# Patient Record
Sex: Female | Born: 1980 | Race: White | Hispanic: No | Marital: Single
Health system: Southern US, Community
[De-identification: ages and names within clinical notes are randomized; demographics above are authoritative.]

---

## 2008-05-16 ENCOUNTER — Emergency Department: Payer: Self-pay | Admitting: Emergency Medicine

## 2012-02-12 ENCOUNTER — Emergency Department: Payer: Self-pay | Admitting: Emergency Medicine

## 2012-02-12 LAB — URINALYSIS, COMPLETE
Bacteria: NONE SEEN
Blood: NEGATIVE
Glucose,UR: NEGATIVE mg/dL (ref 0–75)
Leukocyte Esterase: NEGATIVE
Nitrite: NEGATIVE
Ph: 8 (ref 4.5–8.0)
Protein: NEGATIVE
Specific Gravity: 1.006 (ref 1.003–1.030)
WBC UR: <1 /HPF (ref 0–5)

## 2012-07-12 ENCOUNTER — Emergency Department: Payer: Self-pay | Admitting: Emergency Medicine

## 2012-07-13 ENCOUNTER — Emergency Department: Payer: Self-pay | Admitting: Emergency Medicine

## 2012-07-13 LAB — URINALYSIS, COMPLETE
Bacteria: NONE SEEN
Glucose,UR: NEGATIVE mg/dL (ref 0–75)
Ketone: NEGATIVE
Nitrite: NEGATIVE
Ph: 5 (ref 4.5–8.0)
Protein: NEGATIVE
RBC,UR: 2 /HPF (ref 0–5)
Specific Gravity: 1.017 (ref 1.003–1.030)
WBC UR: 3 /HPF (ref 0–5)

## 2012-07-13 LAB — COMPREHENSIVE METABOLIC PANEL
Albumin: 4.2 g/dL (ref 3.4–5.0)
Alkaline Phosphatase: 74 U/L (ref 50–136)
Anion Gap: 8 (ref 7–16)
BUN: 12 mg/dL (ref 7–18)
Bilirubin,Total: 0.9 mg/dL (ref 0.2–1.0)
Chloride: 107 mmol/L (ref 98–107)
Co2: 25 mmol/L (ref 21–32)
Creatinine: 1.09 mg/dL (ref 0.60–1.30)
EGFR (African American): >60
EGFR (Non-African Amer.): >60
Potassium: 3.9 mmol/L (ref 3.5–5.1)
SGOT(AST): 17 U/L (ref 15–37)
SGPT (ALT): 17 U/L (ref 12–78)
Sodium: 140 mmol/L (ref 136–145)

## 2012-07-13 LAB — CBC
HCT: 43.7 % (ref 35.0–47.0)
Platelet: 305 10*3/uL (ref 150–440)
RDW: 13.2 % (ref 11.5–14.5)

## 2012-07-13 LAB — DRUG SCREEN, URINE
Barbiturates, Ur Screen: POSITIVE (ref ?–200)
Benzodiazepine, Ur Scrn: NEGATIVE (ref ?–200)
Cannabinoid 50 Ng, Ur ~~LOC~~: NEGATIVE (ref ?–50)
Methadone, Ur Screen: NEGATIVE (ref ?–300)
Opiate, Ur Screen: NEGATIVE (ref ?–300)
Phencyclidine (PCP) Ur S: NEGATIVE (ref ?–25)
Tricyclic, Ur Screen: POSITIVE (ref ?–1000)

## 2012-07-13 LAB — TSH: Thyroid Stimulating Horm: 0.87 u[IU]/mL

## 2012-07-13 LAB — ETHANOL
Ethanol %: <0.003 % (ref 0.000–0.080)
Ethanol: <3 mg/dL

## 2012-07-14 LAB — PREGNANCY, URINE: Pregnancy Test, Urine: NEGATIVE m[IU]/mL

## 2012-07-14 LAB — ACETAMINOPHEN LEVEL: Acetaminophen: 4 ug/mL — ABNORMAL LOW

## 2013-01-12 ENCOUNTER — Emergency Department: Payer: Self-pay | Admitting: Emergency Medicine

## 2013-01-12 LAB — ETHANOL
Ethanol %: <0.003 % (ref 0.000–0.080)
Ethanol: <3 mg/dL

## 2013-01-12 LAB — URINALYSIS, COMPLETE
Bilirubin,UR: NEGATIVE
Blood: NEGATIVE
Glucose,UR: NEGATIVE mg/dL (ref 0–75)
Ketone: NEGATIVE
Nitrite: NEGATIVE
Protein: NEGATIVE

## 2013-01-12 LAB — CBC
MCH: 30.3 pg (ref 26.0–34.0)
MCHC: 34.4 g/dL (ref 32.0–36.0)
MCV: 88 fL (ref 80–100)
RBC: 4.48 10*6/uL (ref 3.80–5.20)
RDW: 13.3 % (ref 11.5–14.5)

## 2013-01-12 LAB — DRUG SCREEN, URINE
Amphetamines, Ur Screen: NEGATIVE (ref ?–1000)
Barbiturates, Ur Screen: NEGATIVE (ref ?–200)
Benzodiazepine, Ur Scrn: NEGATIVE (ref ?–200)
Cannabinoid 50 Ng, Ur ~~LOC~~: NEGATIVE (ref ?–50)
Cocaine Metabolite,Ur ~~LOC~~: NEGATIVE (ref ?–300)
MDMA (Ecstasy)Ur Screen: NEGATIVE (ref ?–500)
Methadone, Ur Screen: NEGATIVE (ref ?–300)
Tricyclic, Ur Screen: NEGATIVE (ref ?–1000)

## 2013-01-12 LAB — COMPREHENSIVE METABOLIC PANEL
Anion Gap: 7 (ref 7–16)
BUN: 11 mg/dL (ref 7–18)
Bilirubin,Total: 0.4 mg/dL (ref 0.2–1.0)
Chloride: 106 mmol/L (ref 98–107)
Creatinine: 0.69 mg/dL (ref 0.60–1.30)
Osmolality: 278 (ref 275–301)
SGOT(AST): 16 U/L (ref 15–37)

## 2013-01-12 LAB — TSH: Thyroid Stimulating Horm: 1.16 u[IU]/mL

## 2013-09-03 ENCOUNTER — Ambulatory Visit: Payer: Self-pay | Admitting: Unknown Physician Specialty

## 2013-10-21 ENCOUNTER — Emergency Department: Payer: Self-pay | Admitting: Emergency Medicine

## 2013-10-21 LAB — CBC
HCT: 41.1 % (ref 35.0–47.0)
HGB: 13.5 g/dL (ref 12.0–16.0)
MCH: 29.5 pg (ref 26.0–34.0)
MCHC: 33 g/dL (ref 32.0–36.0)
MCV: 90 fL (ref 80–100)
Platelet: 278 10*3/uL (ref 150–440)
RBC: 4.59 10*6/uL (ref 3.80–5.20)
RDW: 12.9 % (ref 11.5–14.5)
WBC: 7.6 10*3/uL (ref 3.6–11.0)

## 2013-10-21 LAB — URINALYSIS, COMPLETE
BILIRUBIN, UR: NEGATIVE
GLUCOSE, UR: NEGATIVE mg/dL (ref 0–75)
KETONE: NEGATIVE
Leukocyte Esterase: NEGATIVE
NITRITE: NEGATIVE
Ph: 6 (ref 4.5–8.0)
Protein: NEGATIVE
Specific Gravity: 1.019 (ref 1.003–1.030)
WBC UR: 1 /HPF (ref 0–5)

## 2013-10-21 LAB — BASIC METABOLIC PANEL
Anion Gap: 6 — ABNORMAL LOW (ref 7–16)
BUN: 9 mg/dL (ref 7–18)
CHLORIDE: 109 mmol/L — AB (ref 98–107)
Calcium, Total: 9.4 mg/dL (ref 8.5–10.1)
Co2: 26 mmol/L (ref 21–32)
Creatinine: 0.85 mg/dL (ref 0.60–1.30)
EGFR (Non-African Amer.): >60
GLUCOSE: 82 mg/dL (ref 65–99)
OSMOLALITY: 279 (ref 275–301)
POTASSIUM: 4.1 mmol/L (ref 3.5–5.1)
Sodium: 141 mmol/L (ref 136–145)

## 2013-10-21 LAB — GC/CHLAMYDIA PROBE AMP

## 2013-10-21 LAB — WET PREP, GENITAL

## 2013-10-26 ENCOUNTER — Emergency Department: Payer: Self-pay | Admitting: Emergency Medicine

## 2013-10-26 LAB — COMPREHENSIVE METABOLIC PANEL
ALK PHOS: 52 U/L
Albumin: 3.4 g/dL (ref 3.4–5.0)
Anion Gap: 11 (ref 7–16)
BUN: 10 mg/dL (ref 7–18)
Bilirubin,Total: 0.6 mg/dL (ref 0.2–1.0)
CALCIUM: 8.9 mg/dL (ref 8.5–10.1)
CO2: 23 mmol/L (ref 21–32)
Chloride: 107 mmol/L (ref 98–107)
Creatinine: 0.72 mg/dL (ref 0.60–1.30)
Glucose: 98 mg/dL (ref 65–99)
Osmolality: 280 (ref 275–301)
POTASSIUM: 3.6 mmol/L (ref 3.5–5.1)
SGOT(AST): 6 U/L — ABNORMAL LOW (ref 15–37)
SGPT (ALT): 12 U/L (ref 12–78)
Sodium: 141 mmol/L (ref 136–145)
Total Protein: 6.3 g/dL — ABNORMAL LOW (ref 6.4–8.2)

## 2013-10-26 LAB — URINALYSIS, COMPLETE
Bacteria: NONE SEEN
Bilirubin,UR: NEGATIVE
Glucose,UR: NEGATIVE mg/dL (ref 0–75)
Ketone: NEGATIVE
Nitrite: NEGATIVE
PH: 5 (ref 4.5–8.0)
Protein: 30
RBC,UR: 7 /HPF (ref 0–5)
SPECIFIC GRAVITY: 1.025 (ref 1.003–1.030)
WBC UR: NONE SEEN /HPF (ref 0–5)

## 2013-10-26 LAB — CBC
HCT: 40.6 % (ref 35.0–47.0)
HGB: 13.6 g/dL (ref 12.0–16.0)
MCH: 29.6 pg (ref 26.0–34.0)
MCHC: 33.5 g/dL (ref 32.0–36.0)
MCV: 88 fL (ref 80–100)
Platelet: 273 10*3/uL (ref 150–440)
RBC: 4.59 10*6/uL (ref 3.80–5.20)
RDW: 12.8 % (ref 11.5–14.5)
WBC: 8.9 10*3/uL (ref 3.6–11.0)

## 2013-11-25 ENCOUNTER — Emergency Department: Payer: Self-pay | Admitting: Emergency Medicine

## 2013-12-29 ENCOUNTER — Emergency Department: Payer: Self-pay | Admitting: Emergency Medicine

## 2014-01-26 ENCOUNTER — Emergency Department: Payer: Self-pay | Admitting: Emergency Medicine

## 2014-01-26 LAB — CBC
HCT: 40.5 % (ref 35.0–47.0)
HGB: 13.2 g/dL (ref 12.0–16.0)
MCH: 29.6 pg (ref 26.0–34.0)
MCHC: 32.5 g/dL (ref 32.0–36.0)
MCV: 91 fL (ref 80–100)
Platelet: 212 10*3/uL (ref 150–440)
RBC: 4.45 10*6/uL (ref 3.80–5.20)
RDW: 13.3 % (ref 11.5–14.5)
WBC: 9.5 10*3/uL (ref 3.6–11.0)

## 2014-01-26 LAB — URINALYSIS, COMPLETE
Bacteria: NONE SEEN
Bilirubin,UR: NEGATIVE
Glucose,UR: NEGATIVE mg/dL (ref 0–75)
Ketone: NEGATIVE
LEUKOCYTE ESTERASE: NEGATIVE
Nitrite: NEGATIVE
PH: 5 (ref 4.5–8.0)
Protein: NEGATIVE
RBC,UR: 22 /HPF (ref 0–5)
SPECIFIC GRAVITY: 1.015 (ref 1.003–1.030)
Squamous Epithelial: 3
WBC UR: 1 /HPF (ref 0–5)

## 2014-01-26 LAB — LIPASE, BLOOD: Lipase: 133 U/L (ref 73–393)

## 2014-01-26 LAB — HEPATIC FUNCTION PANEL A (ARMC)

## 2014-01-27 LAB — COMPREHENSIVE METABOLIC PANEL
ANION GAP: 6 — AB (ref 7–16)
Albumin: 3.5 g/dL (ref 3.4–5.0)
Alkaline Phosphatase: 48 U/L
BILIRUBIN TOTAL: 0.5 mg/dL (ref 0.2–1.0)
BUN: 10 mg/dL (ref 7–18)
CALCIUM: 8.4 mg/dL — AB (ref 8.5–10.1)
Chloride: 111 mmol/L — ABNORMAL HIGH (ref 98–107)
Co2: 24 mmol/L (ref 21–32)
Creatinine: 0.78 mg/dL (ref 0.60–1.30)
EGFR (Non-African Amer.): >60
Glucose: 91 mg/dL (ref 65–99)
Osmolality: 280 (ref 275–301)
Potassium: 3.8 mmol/L (ref 3.5–5.1)
SGOT(AST): 8 U/L — ABNORMAL LOW (ref 15–37)
SGPT (ALT): 12 U/L — ABNORMAL LOW
Sodium: 141 mmol/L (ref 136–145)
TOTAL PROTEIN: 6.5 g/dL (ref 6.4–8.2)

## 2014-02-21 ENCOUNTER — Emergency Department: Payer: Self-pay | Admitting: Internal Medicine

## 2014-04-12 ENCOUNTER — Ambulatory Visit: Payer: Self-pay | Admitting: Psychiatry

## 2014-04-12 LAB — GLUCOSE, RANDOM: GLUCOSE: 91 mg/dL (ref 65–99)

## 2014-04-12 LAB — LIPID PANEL
CHOLESTEROL: 206 mg/dL — AB (ref 0–200)
HDL: 42 mg/dL (ref 40–60)
LDL CHOLESTEROL, CALC: 125 mg/dL — AB (ref 0–100)
TRIGLYCERIDES: 194 mg/dL (ref 0–200)
VLDL Cholesterol, Calc: 39 mg/dL (ref 5–40)

## 2014-04-12 LAB — TSH: Thyroid Stimulating Horm: 0.392 u[IU]/mL — ABNORMAL LOW

## 2014-08-20 NOTE — Consult Note (Signed)
PATIENT NAME:  Tara Knight, MOULD MR#:  [id number] DATE OF BIRTH:  May 16, 1980  DATE OF CONSULTATION:  01/13/2013  REFERRING PHYSICIAN: Daryel November, MD.  CONSULTING PHYSICIAN:  Ardeen Fillers. Garnetta Buddy, MD.  REASON FOR CONSULTATION: Requesting detox and having suicidal ideations.   HISTORY OF PRESENT ILLNESS: The patient is a 34 year old Caucasian female who presented to the ED accompanied by her boyfriend, Tara Knight, and reported that she has been feeling suicidal and just wants to go to sleep and not wake up. She was tearful during the initial interview. She reported that she is currently experiencing migraine and has been without her medications for several months. She reported that she moved here from [place], [place], and has not been able to see any doctors. She reported that things have been getting worse over the last couple of weeks. Reported that she has been feeling severely depressed and has been crying a lot with mood swings, irritable, and having thoughts of suicide.   She reported that she also recently found out that she has lumps in her breasts and is very worried about the same. The patient reported that she needs help and does not get any medications for her depression.   During my interview, the patient reported that she has been diagnosed with bipolar anxiety and ADD and was seeing Dr. Edmon Crape in [place]. She reported that she moved from [place] and has been off her medications. Since she stopped taking her medications, she has been feeling horrible, worthless, and depressed. She stated that she has been sleeping more as she is experiencing withdrawal from her current medications.   She reported that Dr. Edmon Crape was prescribing her Prozac, Klonopin, trazodone, Vyvanse and  Abilify. She stated that she is not taking the same medications for the past couple of weeks. The patient stated that she is currently not feeling any suicidal thoughts or ideations and just wanted  to be restarted back on the same medications.   When I asked her, why she is unable to find any psychiatrist in the [place] area since she has been in [place] for the past 1 year, she reported that she was going back and forth to see Dr. Edmon Crape in [place], and for the past couple of months she was unable to find anybody.   The patient reported that now she is looking for somebody who accepts [facility] since it is very difficult to find some psychiatrists who will accept her insurance. She reported that she wants the  prescription of her medications and then she will be happy to be discharged from the ED at this time. She currently denied having any perceptual disturbances. She denied having any suicidal or homicidal ideations or plans.   PAST PSYCHIATRIC HISTORY: The patient was also evaluated in March 2014 after she came here and was abusing OxyContin and oxycodone as well as barbiturates and heroin. She was sent to [facility] at that time. She reported history of suicide attempts and also has overdose on pills in the past. At that time, the patient reported that she was following Dr. Edmon Crape in [place] and had not seen him for 2 months as well. The patient reported that she has been trying to get a psychiatrist, and since she was unable to find anybody, she started using opioids.   SUBSTANCE ABUSE HISTORY: The patient has long history of using alcohol, cannabis, cocaine, opioids, and was using oxycodone and OxyContin. Her drug history was obtained from the [facility] and it was  noted that the patient has been steadily using Suboxone, and it was prescribed from the practitioners in [place] and [place] area. She received her last prescription in August 2014. The patient did not provide this information during my interview with her.   SOCIAL HISTORY: The patient was married for 15 years and has 2 children. She reported that her husband has now moved with the children to  [place] and she is currently living with her boyfriend in the [place] area.   REVIEW OF SYSTEMS:  CONSTITUTIONAL: Denies any fever or chills. No weight changes.  EYES: No double or blurred vision.  RESPIRATORY: No shortness of breath or cough.  CARDIOVASCULAR: Denies any chest pain or orthopnea.  GASTROINTESTINAL: No abdominal pain, nausea, vomiting, diarrhea.  GENITOURINARY: No incontinence or frequency.  ENDOCRINE: No heat or cold intolerance.  LYMPHATIC: No anemia or easy bruising.  INTEGUMENTARY: No acne or rash.  MUSCULOSKELETAL: Denies having any muscle pain at this time.  NEUROLOGIC: No tingling or weakness.   MENTAL STATUS: The patient is a moderately built female who appeared her stated age. She was calm and collected. She was manipulative during the interview and was not forthcoming with her information regarding her use of Suboxone. Her thought process was circumstantial. Her thought content was nondelusional. She currently denied having any suicidal or homicidal ideation or plan. She denied having any thoughts to harm herself or others. She denied having any perceptual disturbances.   DIAGNOSTIC IMPRESSION:  AXIS I: 1.  Mood disorder, not otherwise specified.     2.  Polysubstance dependence.  AXIS II: Personality disorder, not otherwise specified.  AXIS III: Please review the medical history.   TREATMENT PLAN:  1.  The patient is not on involuntary commitment and does not meet the criteria for same.  2.  Obtained correct information from Dr. Drema Dallasipton's office and they reported they have not seen this patient since last year. The patient's collateral  information was also obtained from the [facility]  Cont[facility] and she was getting Suboxone steadily from physicians in [place] and [place] area.  3.  She will be discharged from the ED at this time as she does not meet any criteria, and she was provided information about following up with Dr Lucianne MussLima at [facility], an appointment given in the next 2 days. The patient demonstrated understanding. She was advised to come back if she noticed worsening of her symptoms.   Thank you for allowing me to participate in the care of this patient.    ____________________________ Ardeen FillersUzma S. Garnetta BuddyFaheem, MD usf:np D: 01/13/2013 15:52:28 ET T: 01/13/2013 17:22:10 ET JOB#: [id number]  cc: Ardeen FillersUzma S. Garnetta BuddyFaheem, MD, <Dictator> Rhunette CroftUZMA S Rozetta Stumpp MD ELECTRONICALLY SIGNED 01/20/2013 9:07

## 2014-08-20 NOTE — Consult Note (Signed)
PATIENT NAME:  Tara Knight, Tara Knight MR#:  [id number] DATE OF BIRTH:  1980/07/01  DATE OF CONSULTATION:  07/14/2012  REFERRING PHYSICIAN:  Caleen Jobshristine M. Brien MatesBraud, MD    CONSULTING PHYSICIAN:  Ardeen FillersUzma S. Garnetta BuddyFaheem, MD  REASON FOR ADMISSION: Requesting detox voluntarily.    HISTORY OF PRESENT ILLNESS: The patient is a 34 year old married female who came into the ER requesting detox on a voluntary basis. She stated that she came for help. Her UDS was positive for barbiturates. She started crying when placed in the room and reported that she came in here as she was unable to control herself. She was placed under involuntary commitment as a danger to herself as she was attempting to leave later on when the staff was trying to get more information. The patient reported that she had been diagnosed with bipolar disorder at the age of 34. She was living in [place] with her husband and 2 children, and then she started using drugs with her boyfriend. She is currently abusing OxyContin and oxycodone as well as barbiturates and heroin.  The patient stated that she has been using heroin steadily, and then she wants to get off of the drugs. The patient reported that she wants to go to the rehabilitation program to control her drug use. She reported that her husband was supportive and has been taking care of her 117 and 35 year old children. She appeared very sad and tearful during the interview. She reported that she has been living with her boyfriend in [place]. She came to the ER to get help.   PAST PSYCHIATRIC HISTORY: The patient reported history of inpatient hospitalization in the past. She has history of suicide attempts. She overdosed on the pills in the past. She is currently being followed by Dr. Edmon Crapeipton in [place], [place]. Her last appointment was 2 months ago. She was tried on Prozac, Xanax and Vyvanse in the past. The patient reported that she has not taken her medications in a couple of months.    SUBSTANCE ABUSE HISTORY: The patient reported that she has history of using alcohol in the past. She has also tried cannabis as well as used cocaine. She is currently addicted to opioids which have been prescribed on a steady basis, but currently she is taking to oxycodone and OxyContin.   SOCIAL HISTORY: The patient was married for 15 years and has 2 children. She stated that her husband is very supportive and is currently taking care of her children.    REVIEW OF SYSTEMS:  CONSTITUTIONAL: No fever or chills. No weight changes.  EYES: No double or blurred vision.  RESPIRATORY: No shortness of breath or cough.  CARDIOVASCULAR: No chest pain or orthopnea.  GASTROINTESTINAL: No abdominal pain, nausea, vomiting, diarrhea.  GENITOURINARY: No incontinence or frequency.  ENDOCRINE: No heat or cold intolerance.  LYMPHATIC: No anemia or easy bruising.  INTEGUMENTARY: No acne or rash.  MUSCULOSKELETAL: Having muscle pain at this time.  NEUROLOGIC: No tingling or weakness.   MENTAL STATUS EXAMINATION: The patient is a moderately-built female who appeared somewhat anxious and tired during the interview. She reported that her mood is depressed. Affect was congruent. Thought process was logical and goal-directed. Thought content was circumstantial. She denied having any suicidal ideation or plan. She remained focused on her drug use.   DIAGNOSTIC IMPRESSION: AXIS I:  1.  Mood disorder, not otherwise specified.  2.  Opioid dependence.   AXIS II: None.   AXIS III: Please review the medical history.  TREATMENT PLAN: 1.  The patient is currently under involuntary commitment.  2.  I will start her on opioid withdrawal protocol including clonidine 0.1 mg p.o. q. 6 hours p.r.Knight., Flexeril 10 mg p.o. 3 times daily p.r.Knight., lorazepam 0.5 mg p.o. q. 6 hours p.r.Knight. for anxiety symptoms, trazodone 100 mg p.o. at bedtime.  3.  She will be monitored closely for opioid withdrawal and will be referred to [facility]  for stabilization.      Thank you for allowing me to participate in the care of this patient.   ____________________________ Ardeen Fillers. Garnetta Buddy, MD usf:cb D: 07/15/2012 14:12:42 ET T: 07/15/2012 14:31:03 ET JOB#: [id number]  cc: Ardeen Fillers. Garnetta Buddy, MD, <Dictator> Rhunette Croft MD ELECTRONICALLY SIGNED 07/21/2012 15:29

## 2014-09-30 ENCOUNTER — Telehealth: Payer: Self-pay

## 2014-09-30 ENCOUNTER — Ambulatory Visit: Payer: Self-pay | Admitting: Psychiatry

## 2014-09-30 MED ORDER — FLUOXETINE HCL 20 MG PO CAPS: 60.0000 mg | ORAL_CAPSULE | Freq: Every day | ORAL | Status: AC

## 2014-09-30 MED ORDER — ZOLPIDEM TARTRATE ER 12.5 MG PO TBCR: 12.5000 mg | EXTENDED_RELEASE_TABLET | Freq: Every evening | ORAL | Status: DC | PRN

## 2014-09-30 MED ORDER — ZOLPIDEM TARTRATE 10 MG PO TABS: 10.0000 mg | ORAL_TABLET | Freq: Every evening | ORAL | Status: AC | PRN

## 2014-09-30 MED ORDER — CLONAZEPAM 2 MG PO TABS: 2.0000 mg | ORAL_TABLET | Freq: Two times a day (BID) | ORAL | Status: AC

## 2014-09-30 NOTE — Telephone Encounter (Signed)
pt states she is going out of town Friday and will be gone for several weeks . pt states she needs refills on all her medication.  pt missed her appt last week because she got called into work. Pt states she could not come into office to see you before she leaves out of town but that she will call and make appt when she does come back.  Pt states she could not make appt because she is not sure when she will be coming back. Pt wants to get printed out rx to pick up

## 2014-09-30 NOTE — Telephone Encounter (Signed)
TC on 09-30-14 @ 2:38 pt was told RX are at the front desk for her to pick up.

## 2014-09-30 NOTE — Telephone Encounter (Signed)
Patient has been provided with hard copy prescriptions. Patient can come by to get them. You can close encounter. AW

## 2014-10-12 ENCOUNTER — Other Ambulatory Visit: Payer: Self-pay

## 2014-10-12 DIAGNOSIS — F3176 Bipolar disorder, in full remission, most recent episode depressed: Secondary | ICD-10-CM

## 2014-10-12 DIAGNOSIS — F900 Attention-deficit hyperactivity disorder, predominantly inattentive type: Secondary | ICD-10-CM

## 2014-10-12 DIAGNOSIS — F3132 Bipolar disorder, current episode depressed, moderate: Secondary | ICD-10-CM

## 2014-10-12 MED ORDER — QUETIAPINE FUMARATE 100 MG PO TABS: 100.0000 mg | ORAL_TABLET | Freq: Every day | ORAL | Status: AC

## 2014-10-12 NOTE — Telephone Encounter (Signed)
pt called pt states she is Tara Knight , Tara Knight and that you gave her her Remus Loffler but did not give her a refill for seroquel   Pt uses [facility] on [address] [place] [phone]

## 2014-10-12 NOTE — Telephone Encounter (Signed)
I sent in a prescription and Seroquel 100 mg, #30 with one refill. AW

## 2015-07-17 IMAGING — US US PELV - US TRANSVAGINAL
1 series · 13 of 25 positions shown · non-contrast
Comparison: None

CLINICAL DATA: Pelvic pain.  Irregular uterine bleeding.

EXAM:
TRANSABDOMINAL AND TRANSVAGINAL ULTRASOUND OF PELVIS
TECHNIQUE: Both transabdominal and transvaginal ultrasound examinations of the
pelvis were performed. Transabdominal technique was performed for
global imaging of the pelvis including uterus, ovaries, adnexal
regions, and pelvic cul-de-sac. It was necessary to proceed with
endovaginal exam following the transabdominal exam to visualize the
endometrium and ovaries.

[Series 1: us pelv - us transvaginal · 0.21mm/px · 13 of 83 slices shown]
[im 1/83]
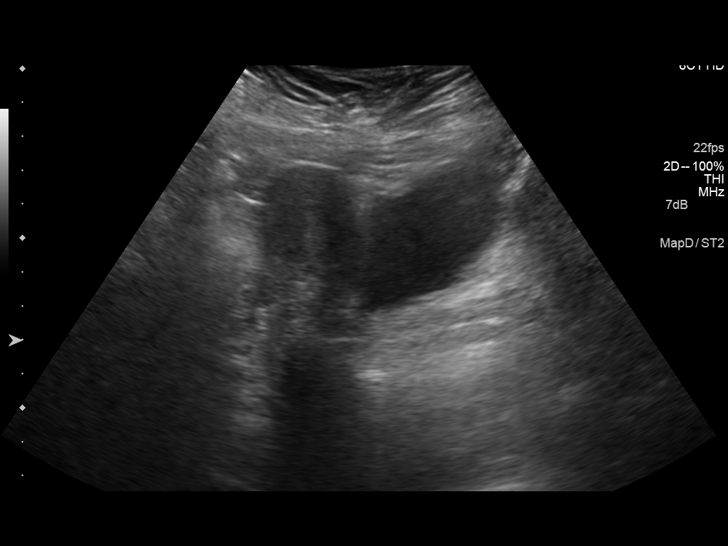
[im 7/83]
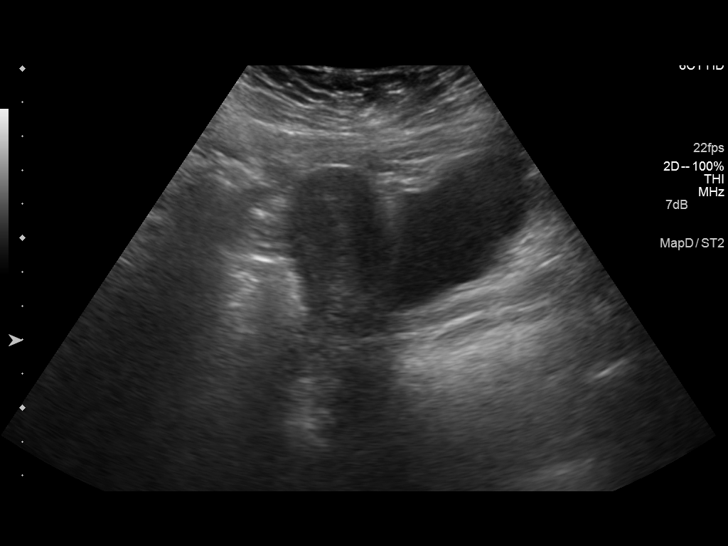
[im 14/83]
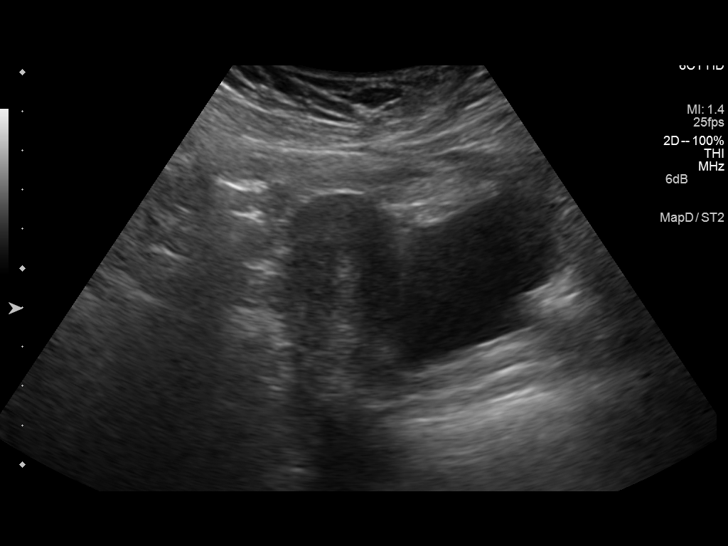
[im 21/83]
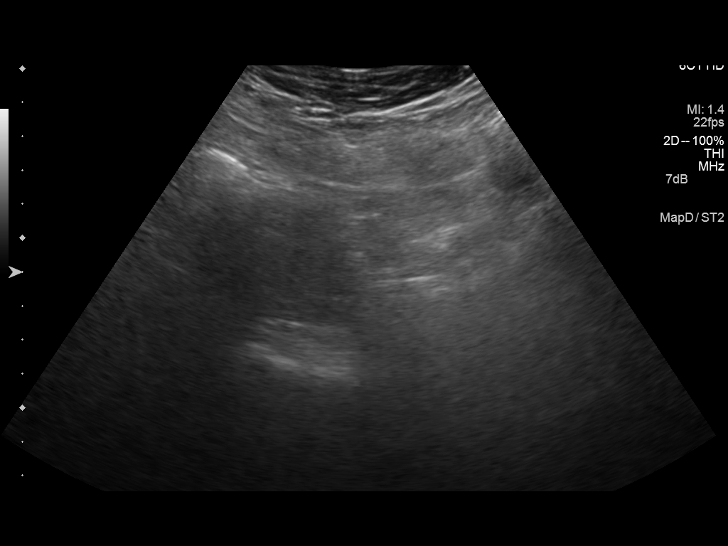
[im 28/83]
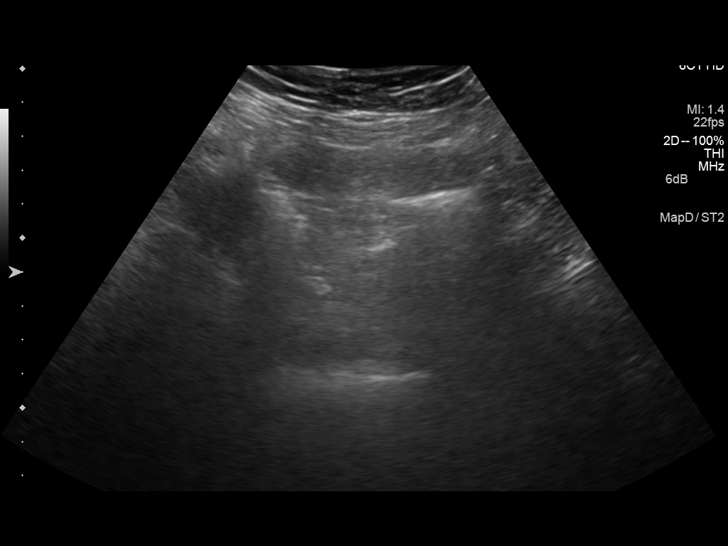
[im 35/83]
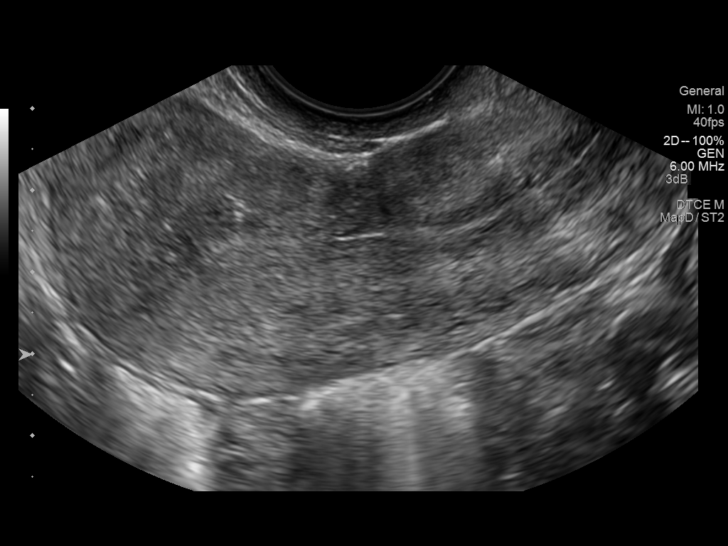
[im 42/83]
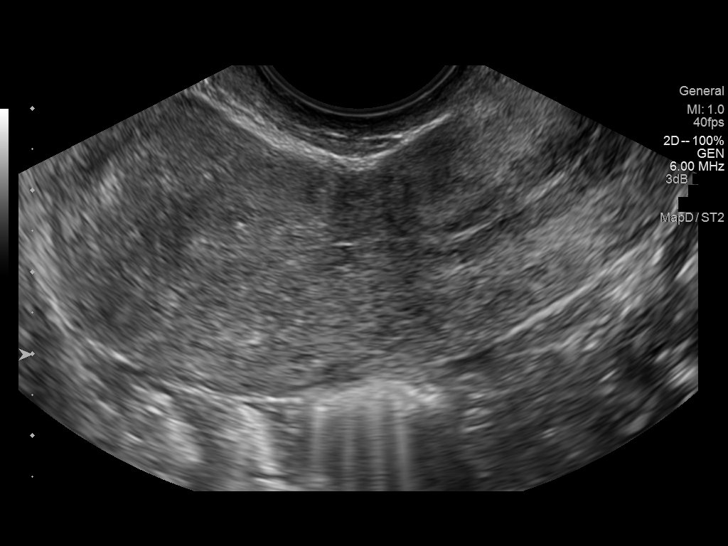
[im 48/83]
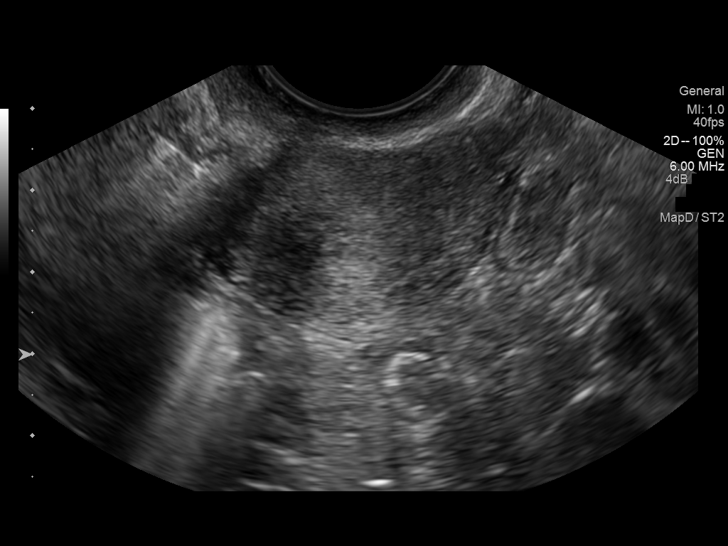
[im 55/83]
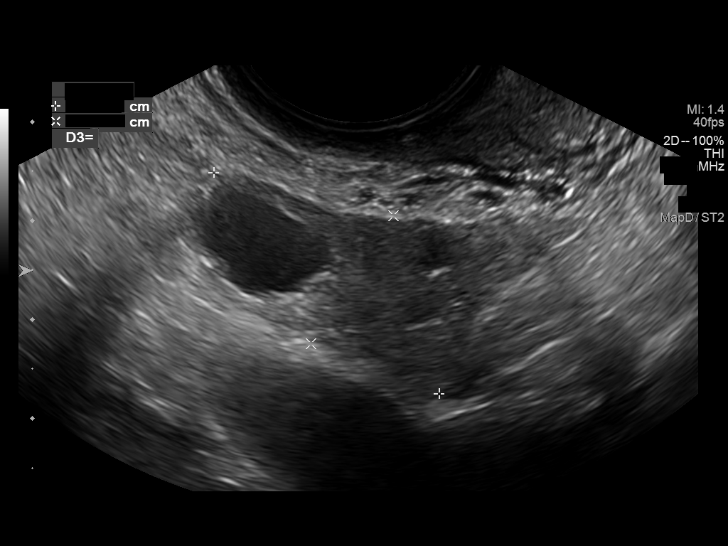
[im 62/83]
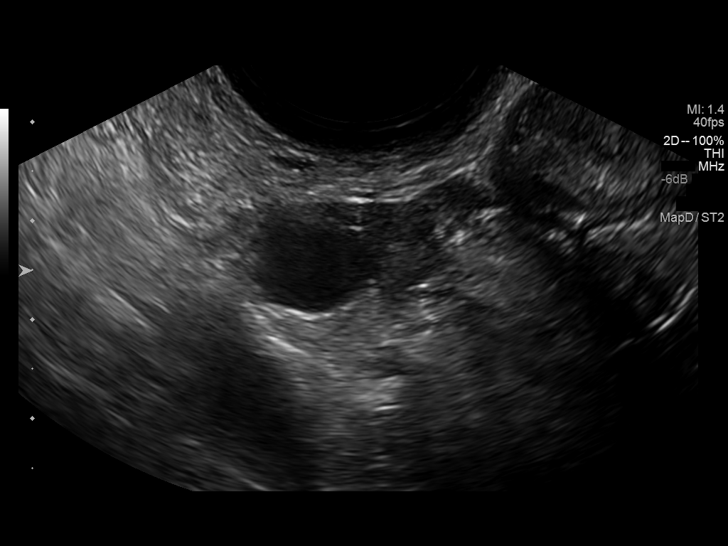
[im 69/83]
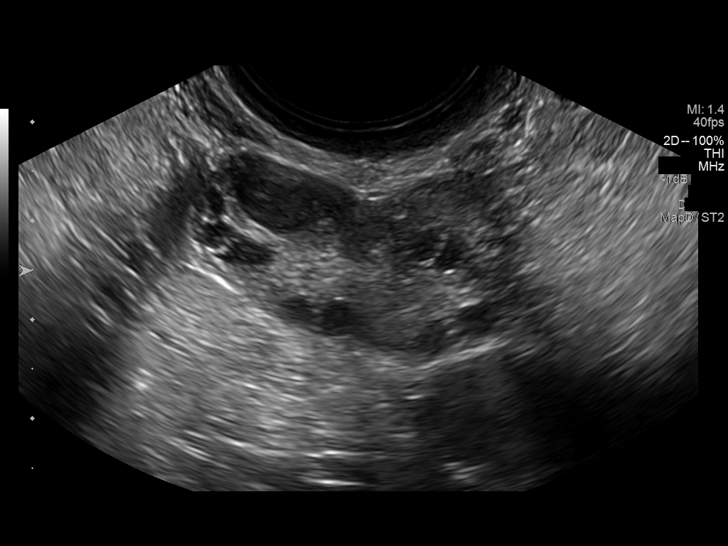
[im 76/83]
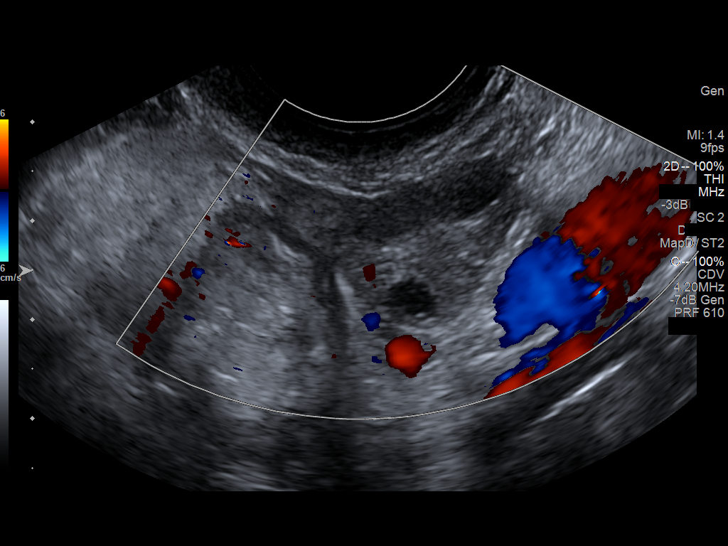
[im 83/83]
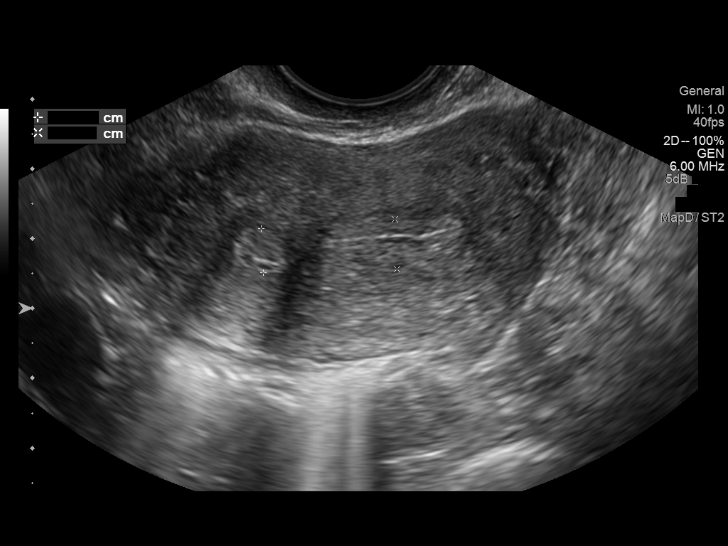

[13 of 25 positions shown; findings below may reference images not displayed]

FINDINGS: Uterus

Measurements: Normal in size at 7.6 x 3.7 x 5.4 cm. There are 2
separate endometrial stripes noted in the transverse plane
consistent with a congenital uterine anomaly. Patient reports
bicornuate uterus.

Endometrium

Thickness: 7 mm and 6 mm. Normal thickness for premenopausal female.

Right ovary

Measurements: Normal at 3.2 x 1.5 x 2.1 cm.. Dominant follicle
noted.

Left ovary

Measurements: Normal at 3.3 x 1.6 x 1.9 cm.. No mass.

Other findings

No free fluid
IMPRESSION: 1. No acute findings by pelvic ultrasound.
2. There is a congenital Mullerian uterine anomaly with 2
endometrial canals identified. This cannot be fully characterize by
pelvic ultrasound. Patient, by report, has a bicornuate uterus. If
further evaluation is relevant, recommend MRI of the pelvis.
3. The endometrial canals are within normal limits for premenopausal
female.
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
